# Patient Record
Sex: Female | Born: 1965 | Race: White | Hispanic: No | Marital: Single | Smoking: Never smoker
Health system: Southern US, Community
[De-identification: ages and names within clinical notes are randomized; demographics above are authoritative.]

---

## 2000-12-08 ENCOUNTER — Other Ambulatory Visit: Admission: RE | Admit: 2000-12-08 | Discharge: 2000-12-08 | Payer: Self-pay | Admitting: Obstetrics and Gynecology

## 2002-01-06 ENCOUNTER — Other Ambulatory Visit: Admission: RE | Admit: 2002-01-06 | Discharge: 2002-01-06 | Payer: Self-pay | Admitting: Obstetrics and Gynecology

## 2003-02-01 ENCOUNTER — Other Ambulatory Visit: Admission: RE | Admit: 2003-02-01 | Discharge: 2003-02-01 | Payer: Self-pay | Admitting: Obstetrics and Gynecology

## 2004-02-26 ENCOUNTER — Other Ambulatory Visit: Admission: RE | Admit: 2004-02-26 | Discharge: 2004-02-26 | Payer: Self-pay | Admitting: Obstetrics and Gynecology

## 2004-06-06 ENCOUNTER — Ambulatory Visit (HOSPITAL_COMMUNITY): Admission: RE | Admit: 2004-06-06 | Discharge: 2004-06-06 | Payer: Self-pay | Admitting: Obstetrics and Gynecology

## 2004-06-27 ENCOUNTER — Ambulatory Visit: Payer: Self-pay | Admitting: Internal Medicine

## 2004-12-02 ENCOUNTER — Ambulatory Visit: Payer: Self-pay | Admitting: Internal Medicine

## 2005-05-23 ENCOUNTER — Other Ambulatory Visit: Admission: RE | Admit: 2005-05-23 | Discharge: 2005-05-23 | Payer: Self-pay | Admitting: Obstetrics and Gynecology

## 2006-07-28 ENCOUNTER — Inpatient Hospital Stay (HOSPITAL_COMMUNITY): Admission: AD | Admit: 2006-07-28 | Discharge: 2006-08-04 | Payer: Self-pay | Admitting: Obstetrics and Gynecology

## 2006-07-28 ENCOUNTER — Ambulatory Visit: Payer: Self-pay | Admitting: Neonatology

## 2006-07-28 IMAGING — US US OB COMP +14 WK
1 series · 11 of 11 positions shown · non-contrast
Comparison: none

OBSTETRICAL ULTRASOUND:

 This ultrasound exam was performed in the [HOSPITAL] Ultrasound Department.  The OB US report was generated in the AS system, and faxed to the ordering physician.  This report is also available in [REDACTED] PACS.

[Series 1: us ob comp +14 wk · 11 of 11 slices shown]
[im 1/11]
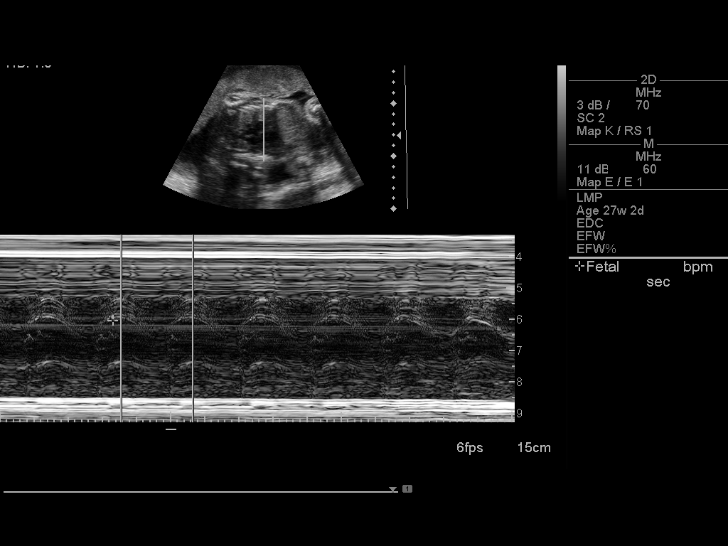
[im 2/11]
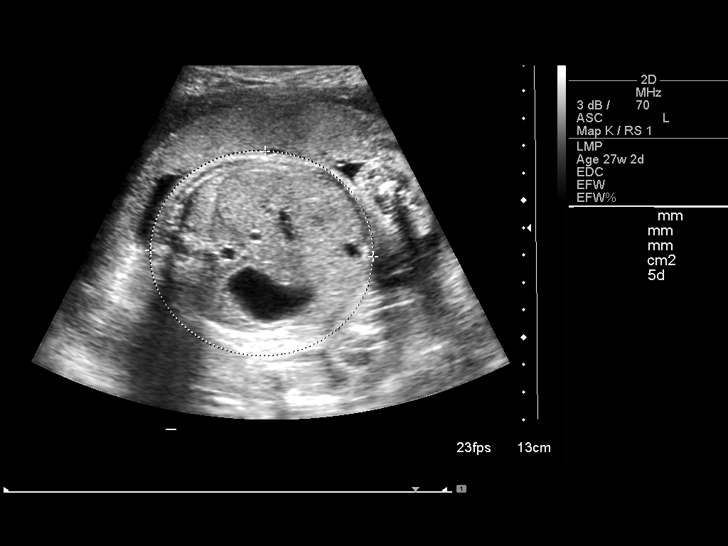
[im 3/11]
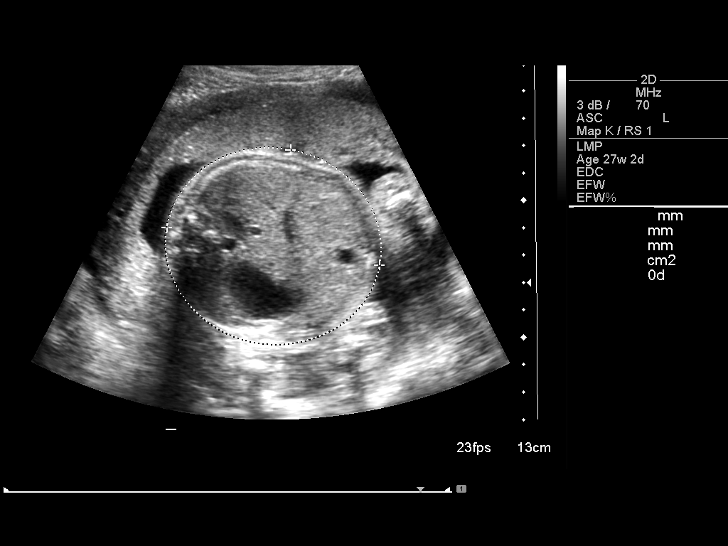
[im 4/11]
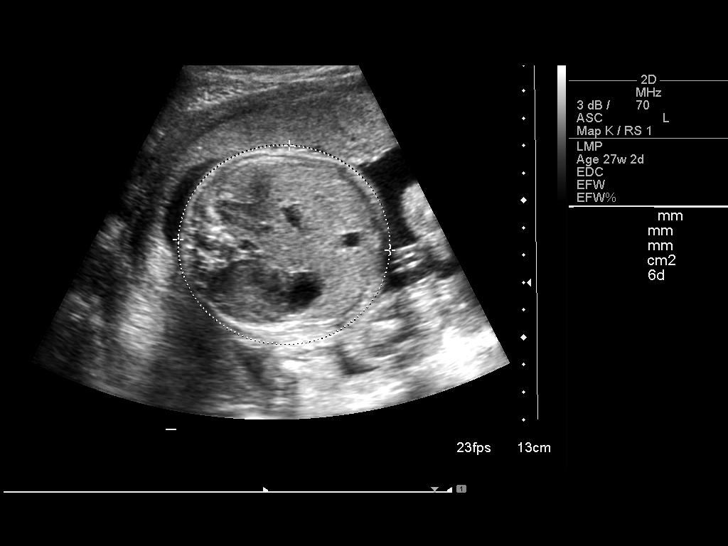
[im 5/11]
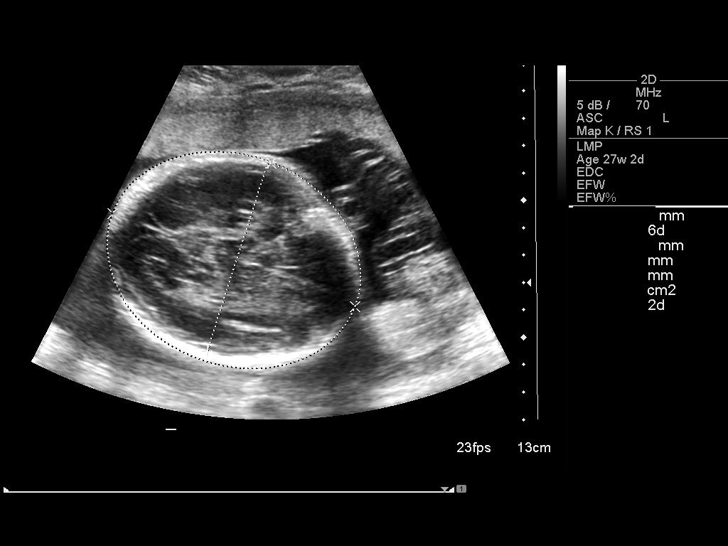
[im 6/11]
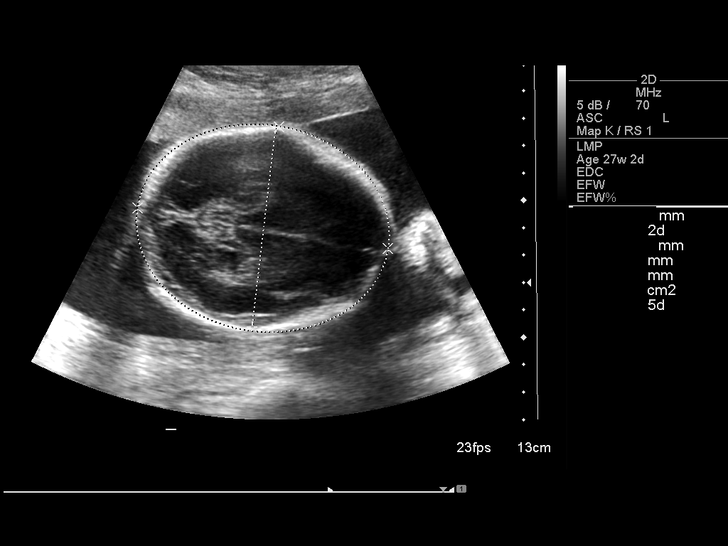
[im 7/11]
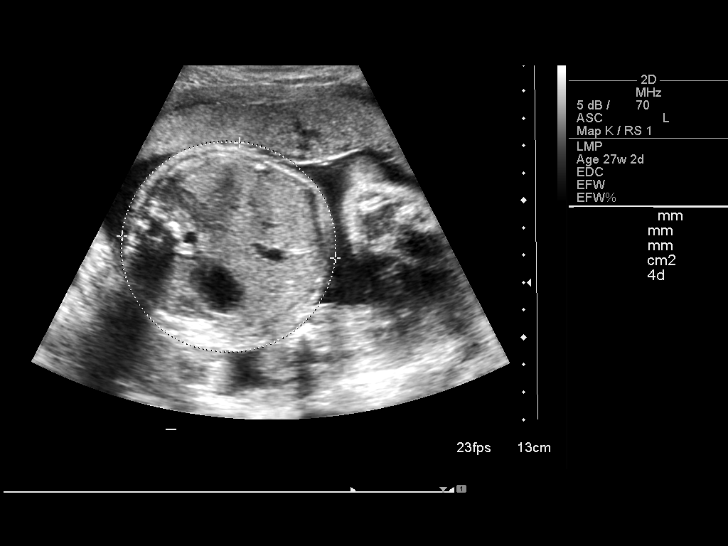
[im 8/11]
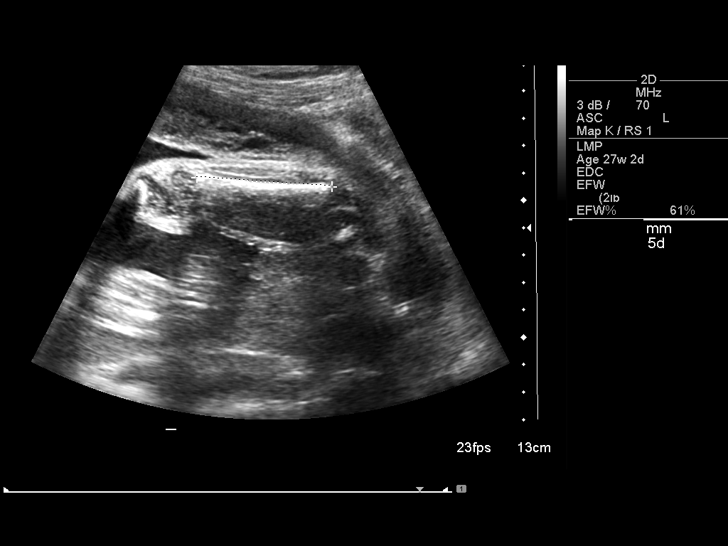
[im 9/11]
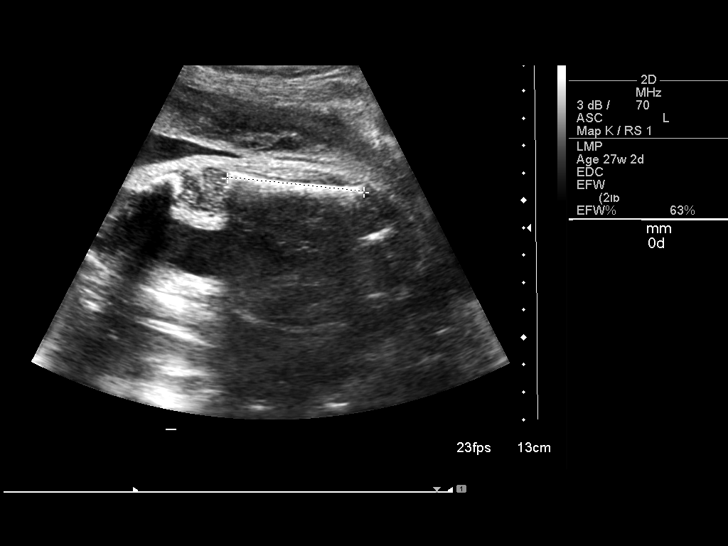
[im 10/11]
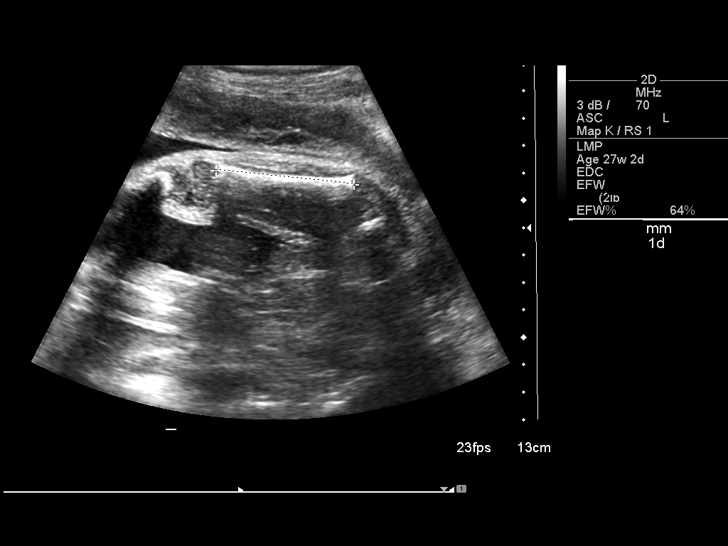
[im 11/11]
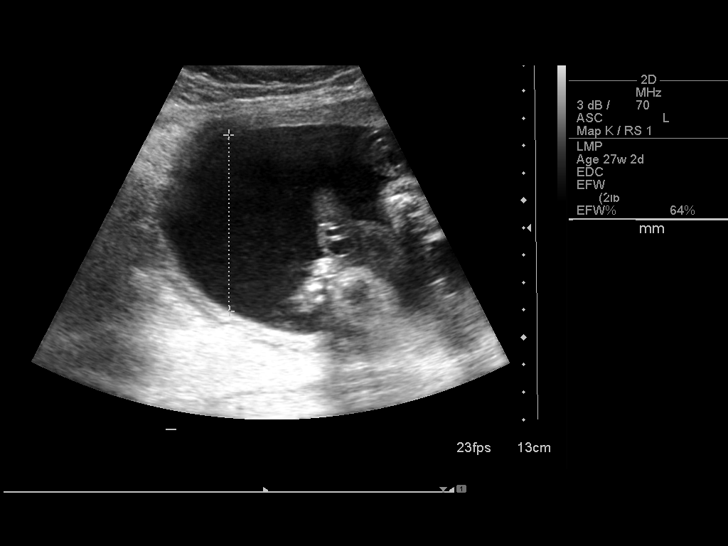

[11 of 11 positions shown; findings below may reference images not displayed]

IMPRESSION: See [facility] Obstetric US report.

## 2010-08-21 ENCOUNTER — Other Ambulatory Visit: Payer: Self-pay | Admitting: Obstetrics and Gynecology

## 2013-02-25 ENCOUNTER — Other Ambulatory Visit: Payer: Self-pay | Admitting: Obstetrics and Gynecology

## 2014-03-01 ENCOUNTER — Other Ambulatory Visit: Payer: Self-pay | Admitting: Obstetrics and Gynecology

## 2014-03-02 LAB — CYTOLOGY - PAP

## 2015-03-21 ENCOUNTER — Other Ambulatory Visit: Payer: Self-pay | Admitting: Obstetrics and Gynecology

## 2015-03-22 LAB — CYTOLOGY - PAP

## 2016-07-23 ENCOUNTER — Other Ambulatory Visit: Payer: Self-pay | Admitting: Obstetrics and Gynecology

## 2016-07-24 LAB — CYTOLOGY - PAP

## 2020-01-30 LAB — EXTERNAL GENERIC LAB PROCEDURE: COLOGUARD: NEGATIVE

## 2020-06-18 ENCOUNTER — Encounter: Payer: Self-pay | Admitting: Podiatry

## 2020-06-18 ENCOUNTER — Other Ambulatory Visit: Payer: Self-pay

## 2020-06-18 ENCOUNTER — Ambulatory Visit (INDEPENDENT_AMBULATORY_CARE_PROVIDER_SITE_OTHER): Payer: BC Managed Care – PPO | Admitting: Podiatry

## 2020-06-18 ENCOUNTER — Ambulatory Visit (INDEPENDENT_AMBULATORY_CARE_PROVIDER_SITE_OTHER): Payer: BC Managed Care – PPO

## 2020-06-18 DIAGNOSIS — M79672 Pain in left foot: Secondary | ICD-10-CM

## 2020-06-18 DIAGNOSIS — M722 Plantar fascial fibromatosis: Secondary | ICD-10-CM

## 2020-06-18 MED ORDER — DICLOFENAC SODIUM 75 MG PO TBEC: 75.0000 mg | DELAYED_RELEASE_TABLET | Freq: Two times a day (BID) | ORAL | 2 refills | Status: DC

## 2020-06-18 NOTE — Patient Instructions (Signed)

## 2020-06-19 NOTE — Progress Notes (Signed)
Subjective:   Patient ID: Marilyn Rogers, female   DOB: 55 y.o.   MRN: [id number]   HPI Patient presents stating she has had significant left heel pain of 6 months duration states is been very sore and makes walking difficult.  Does not remember injury and does not smoke likes to be active   Review of Systems  All other systems reviewed and are negative.       Objective:  Physical Exam Vitals and nursing note reviewed.  Constitutional:      Appearance: She is well-developed and well-nourished.  Cardiovascular:     Pulses: Intact distal pulses.  Pulmonary:     Effort: Pulmonary effort is normal.  Musculoskeletal:        General: Normal range of motion.  Skin:    General: Skin is warm.  Neurological:     Mental Status: She is alert.     Neurovascular status intact muscle strength found to be adequate range of motion within normal limits.  Patient is noted to have exquisite discomfort plantar aspect left heel at the insertional point of the tendon into the calcaneus with inflammation fluid around the medial band.  Patient is found to have good digital perfusion well oriented x3     Assessment:  Acute plantar fasciitis left with inflammation fluid of the medial band     Plan:  H&P x-rays reviewed and education rendered to patient.  Sterile prep injected the plantar fashion 3 mg Kenalog 5 mg Xylocaine applied fascial brace instructed on physical therapy and support and reappoint to recheck  X-rays indicate spur of the plantar heel left no indications of stress fracture arthritis

## 2020-07-02 ENCOUNTER — Ambulatory Visit: Payer: BC Managed Care – PPO | Admitting: Podiatry

## 2020-08-24 ENCOUNTER — Other Ambulatory Visit: Payer: Self-pay | Admitting: Podiatry

## 2020-08-24 NOTE — Telephone Encounter (Signed)
Please advise 

## 2020-10-05 ENCOUNTER — Other Ambulatory Visit: Payer: Self-pay

## 2020-10-05 ENCOUNTER — Encounter: Payer: Self-pay | Admitting: Podiatry

## 2020-10-05 ENCOUNTER — Ambulatory Visit (INDEPENDENT_AMBULATORY_CARE_PROVIDER_SITE_OTHER): Payer: BC Managed Care – PPO | Admitting: Podiatry

## 2020-10-05 DIAGNOSIS — M722 Plantar fascial fibromatosis: Secondary | ICD-10-CM

## 2020-10-05 MED ORDER — MELOXICAM 15 MG PO TABS: 15.0000 mg | ORAL_TABLET | Freq: Every day | ORAL | 2 refills | Status: AC

## 2020-10-05 MED ORDER — TRIAMCINOLONE ACETONIDE 10 MG/ML IJ SUSP
10.0000 mg | Freq: Once | INTRAMUSCULAR | Status: AC
  Administered 2020-10-05: 10 mg

## 2020-10-09 NOTE — Progress Notes (Signed)
Subjective:   Patient ID: Marilyn Rogers, female   DOB: 55 y.o.   MRN: [id number]   HPI Patient states she has had a reoccurrence of acute discomfort in the left plantar fascia at the insertion of the tendon into the calcaneus   ROS      Objective:  Physical Exam  Acute fasciitis left at the insertional point tendon calcaneus with moderate depression of the arch     Assessment:  Continued fascial inflammation left with pain upon palpation and flattening of the arch     Plan:  H&P reviewed condition sterile prep injected the fascia 3 mg Kenalog 5 mg Xylocaine and advised on orthotics to lift up the arch with instructions on usage.  Casted for functional orthotics today will be seen back to recheck

## 2020-10-29 ENCOUNTER — Other Ambulatory Visit: Payer: Self-pay

## 2020-10-29 ENCOUNTER — Ambulatory Visit (INDEPENDENT_AMBULATORY_CARE_PROVIDER_SITE_OTHER): Payer: BC Managed Care – PPO | Admitting: Podiatry

## 2020-10-29 ENCOUNTER — Encounter: Payer: Self-pay | Admitting: Podiatry

## 2020-10-29 ENCOUNTER — Ambulatory Visit (INDEPENDENT_AMBULATORY_CARE_PROVIDER_SITE_OTHER): Payer: BC Managed Care – PPO | Admitting: *Deleted

## 2020-10-29 DIAGNOSIS — M722 Plantar fascial fibromatosis: Secondary | ICD-10-CM

## 2020-10-29 NOTE — Progress Notes (Signed)
Subjective:   Patient ID: Marilyn Rogers, female   DOB: 56 y.o.   MRN: [id number]   HPI Patient presents stating she is doing better but still getting pain in her heel especially with activity   ROS      Objective:  Physical Exam  Neurovascular status intact narrow heel diminished fat pad with inflammation plantar heel still present with moderate improvement     Assessment:  Chronic Planter fasciitis about 1 year duration that is also related to a diminished fat pad and a thin tight heel     Plan:  H&P reviewed condition great length discussed the possibilities long-term for shockwave therapy or more aggressive conservative treatment and at this point begin orthotic usage.  Do not recommend further injection I did spend a great deal of time trying to differentiate hers from other ones that may be of more typical type deformity

## 2020-10-29 NOTE — Progress Notes (Signed)
Patient presents today to pick up custom molded foot orthotics, diagnosed with plantar fasciitis by Dr. Charlsie Merles.   Orthotics were dispensed and fit was satisfactory. Reviewed instructions for break-in and wear. Written instructions given to patient.  Patient will follow up as needed.   [facility] - order # [id number]

## 2023-04-08 ENCOUNTER — Ambulatory Visit: Payer: BC Managed Care – PPO | Admitting: Podiatry

## 2023-04-17 ENCOUNTER — Encounter: Payer: Self-pay | Admitting: Podiatry

## 2023-04-17 ENCOUNTER — Ambulatory Visit (INDEPENDENT_AMBULATORY_CARE_PROVIDER_SITE_OTHER): Payer: BC Managed Care – PPO | Admitting: Podiatry

## 2023-04-17 ENCOUNTER — Ambulatory Visit (INDEPENDENT_AMBULATORY_CARE_PROVIDER_SITE_OTHER): Payer: BC Managed Care – PPO

## 2023-04-17 DIAGNOSIS — M79672 Pain in left foot: Secondary | ICD-10-CM | POA: Diagnosis not present

## 2023-04-17 DIAGNOSIS — M722 Plantar fascial fibromatosis: Secondary | ICD-10-CM

## 2023-04-17 MED ORDER — TRIAMCINOLONE ACETONIDE 10 MG/ML IJ SUSP
10.0000 mg | Freq: Once | INTRAMUSCULAR | Status: AC
  Administered 2023-04-17: 10 mg via INTRA_ARTICULAR

## 2023-04-17 NOTE — Progress Notes (Signed)
Subjective:   Patient ID: Marilyn Rogers, female   DOB: 57 y.o.   MRN: [id number]   HPI Patient presents stating she has had a flareup on her left heel and most likely needs new orthotics that she has been wearing them every day for almost 3 years    ROS      Objective:  Physical Exam  Neurovascular status intact muscle strength was adequate with inflammation fluid around the medial aspect of the left plantar fascia at insertion with orthotics which have lost a moderate amount of their support mechanism over this last almost 3 years     Assessment:  Acute plantar fasciitis left with patient having worn in appropriate shoes for extended periods of time along with mechanical dysfunction arch     Plan:  H&P x-rays reviewed sterile prep injected the fascia at insertion 3 mg Kenalog 5 mg Xylocaine discussed orthotic and will see back 3 weeks and that will be the plan  X-rays indicate there is been a slight increase in spur size left heel

## 2023-05-08 ENCOUNTER — Encounter: Payer: Self-pay | Admitting: Podiatry

## 2023-05-08 ENCOUNTER — Ambulatory Visit (INDEPENDENT_AMBULATORY_CARE_PROVIDER_SITE_OTHER): Payer: BC Managed Care – PPO | Admitting: Podiatry

## 2023-05-08 DIAGNOSIS — M722 Plantar fascial fibromatosis: Secondary | ICD-10-CM

## 2023-05-08 NOTE — Progress Notes (Unsigned)
Orthotic eval   Patient was seen, measured / scanned for custom molded foot orthotics.  Patient will benefit from CFO's as they will help provide total contact to MLA's helping to better distribute body weight across BIL feet greater reducing plantar pressure and pain and to also encourage FF and RF alignment.  Patient was scanned items to be ordered and fit when in  [employer], CFo, CFm

## 2023-05-10 NOTE — Progress Notes (Signed)
Subjective:   Patient ID: Marilyn Rogers, female   DOB: 57 y.o.   MRN: [id number]   HPI Patient presents stating it is improved but I know I will need some type of long-term support   ROS      Objective:  Physical Exam  Neurovascular status intact discomfort that is manageable to a higher degree now left plantar heel with still some prominence of the heel itself     Assessment:  Plantar fasciitis left improving but significant structural foot issues     Plan:  H&P and also discussed the diminished fat pad plantar left.  At this point I went ahead and casted for functional orthotics and this was done by pedorthist who approved at this for this patient.  She will call patient and be treated and fitted when orthotics returned

## 2023-06-15 ENCOUNTER — Ambulatory Visit: Payer: BC Managed Care – PPO

## 2023-06-15 NOTE — Progress Notes (Signed)
Patient presents today to pick up custom molded foot orthotics, diagnosed with PF by Dr. Charlsie Merles.   Orthotics were dispensed and fit was satisfactory. Reviewed instructions for break-in and wear. Written instructions given to patient.  Patient will follow up as needed.   Addison Bailey Cped, CFo, CFm
